# Patient Record
Sex: Female | Born: 1985 | Hispanic: No | Marital: Single | Smoking: Former smoker
Health system: Southern US, Community
[De-identification: ages and names within clinical notes are randomized; demographics above are authoritative.]

## PROBLEM LIST (undated history)

## (undated) DIAGNOSIS — F329 Major depressive disorder, single episode, unspecified: Secondary | ICD-10-CM

## (undated) DIAGNOSIS — F32A Depression, unspecified: Secondary | ICD-10-CM

## (undated) HISTORY — PX: NO PAST SURGERIES: SHX2092

---

## 2012-10-13 ENCOUNTER — Emergency Department (HOSPITAL_COMMUNITY): Payer: Self-pay

## 2012-10-13 ENCOUNTER — Emergency Department (HOSPITAL_COMMUNITY)
Admission: EM | Admit: 2012-10-13 | Discharge: 2012-10-13 | Disposition: A | Discharge status: Home / Self Care | Payer: Self-pay | Attending: Emergency Medicine | Admitting: Emergency Medicine

## 2012-10-13 ENCOUNTER — Encounter (HOSPITAL_COMMUNITY): Payer: Self-pay | Admitting: *Deleted

## 2012-10-13 DIAGNOSIS — S0010XA Contusion of unspecified eyelid and periocular area, initial encounter: Secondary | ICD-10-CM | POA: Insufficient documentation

## 2012-10-13 DIAGNOSIS — S0003XA Contusion of scalp, initial encounter: Secondary | ICD-10-CM | POA: Insufficient documentation

## 2012-10-13 DIAGNOSIS — S0083XA Contusion of other part of head, initial encounter: Secondary | ICD-10-CM

## 2012-10-13 DIAGNOSIS — T148XXA Other injury of unspecified body region, initial encounter: Secondary | ICD-10-CM

## 2012-10-13 MED ORDER — IBUPROFEN 600 MG PO TABS: 600.0000 mg | ORAL_TABLET | Freq: Four times a day (QID) | ORAL | Status: DC | PRN
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
## 2012-10-13 NOTE — ED Notes (Signed)
Returned from CT.
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
## 2012-10-13 NOTE — ED Provider Notes (Signed)
Medical screening examination/treatment/procedure(s) were performed by non-physician practitioner and as supervising physician I was immediately available for consultation/collaboration.   Sally Menard B. Lavaughn Bisig, MD 10/13/12 2240 

## 2012-10-13 NOTE — ED Notes (Signed)
Patient transported to CT 

## 2013-08-30 ENCOUNTER — Other Ambulatory Visit: Payer: Self-pay | Admitting: Family

## 2013-08-30 ENCOUNTER — Encounter (HOSPITAL_COMMUNITY): Payer: Self-pay | Admitting: Emergency Medicine

## 2013-08-30 ENCOUNTER — Emergency Department (HOSPITAL_COMMUNITY)
Admission: EM | Admit: 2013-08-30 | Discharge: 2013-08-30 | Disposition: A | Discharge status: Other Acute Inpatient Hospital | Payer: BC Managed Care – PPO | Attending: Emergency Medicine | Admitting: Emergency Medicine

## 2013-08-30 ENCOUNTER — Inpatient Hospital Stay (HOSPITAL_COMMUNITY)
Admission: AD | Admit: 2013-08-30 | Discharge: 2013-09-03 | DRG: 897 | Disposition: A | Discharge status: Home / Self Care | Payer: No Typology Code available for payment source | Source: Intra-hospital | Attending: Psychiatry | Admitting: Psychiatry

## 2013-08-30 ENCOUNTER — Encounter (HOSPITAL_COMMUNITY): Payer: Self-pay

## 2013-08-30 DIAGNOSIS — R21 Rash and other nonspecific skin eruption: Secondary | ICD-10-CM | POA: Insufficient documentation

## 2013-08-30 DIAGNOSIS — R000 Tachycardia, unspecified: Secondary | ICD-10-CM | POA: Insufficient documentation

## 2013-08-30 DIAGNOSIS — F1010 Alcohol abuse, uncomplicated: Secondary | ICD-10-CM

## 2013-08-30 DIAGNOSIS — F17200 Nicotine dependence, unspecified, uncomplicated: Secondary | ICD-10-CM | POA: Insufficient documentation

## 2013-08-30 DIAGNOSIS — R112 Nausea with vomiting, unspecified: Secondary | ICD-10-CM | POA: Insufficient documentation

## 2013-08-30 DIAGNOSIS — F1020 Alcohol dependence, uncomplicated: Principal | ICD-10-CM

## 2013-08-30 DIAGNOSIS — Z5987 Material hardship due to limited financial resources, not elsewhere classified: Secondary | ICD-10-CM

## 2013-08-30 DIAGNOSIS — Z8659 Personal history of other mental and behavioral disorders: Secondary | ICD-10-CM | POA: Insufficient documentation

## 2013-08-30 DIAGNOSIS — F329 Major depressive disorder, single episode, unspecified: Secondary | ICD-10-CM | POA: Diagnosis present

## 2013-08-30 DIAGNOSIS — F411 Generalized anxiety disorder: Secondary | ICD-10-CM

## 2013-08-30 DIAGNOSIS — Z598 Other problems related to housing and economic circumstances: Secondary | ICD-10-CM

## 2013-08-30 DIAGNOSIS — Z23 Encounter for immunization: Secondary | ICD-10-CM

## 2013-08-30 DIAGNOSIS — F3289 Other specified depressive episodes: Secondary | ICD-10-CM | POA: Diagnosis present

## 2013-08-30 HISTORY — DX: Depression, unspecified: F32.A

## 2013-08-30 HISTORY — DX: Major depressive disorder, single episode, unspecified: F32.9

## 2013-08-30 LAB — COMPREHENSIVE METABOLIC PANEL
Albumin: 4.6 g/dL (ref 3.5–5.2)
BUN: 7 mg/dL (ref 6–23)
Calcium: 9.3 mg/dL (ref 8.4–10.5)
Creatinine, Ser: 0.79 mg/dL (ref 0.50–1.10)
Total Bilirubin: 0.5 mg/dL (ref 0.3–1.2)
Total Protein: 8 g/dL (ref 6.0–8.3)

## 2013-08-30 LAB — CBC
HCT: 39 % (ref 36.0–46.0)
MCH: 28.7 pg (ref 26.0–34.0)
MCHC: 33.8 g/dL (ref 30.0–36.0)
MCV: 84.8 fL (ref 78.0–100.0)
RDW: 14.5 % (ref 11.5–15.5)

## 2013-08-30 LAB — SALICYLATE LEVEL: Salicylate Lvl: <2 mg/dL — ABNORMAL LOW (ref 2.8–20.0)

## 2013-08-30 LAB — ETHANOL: Alcohol, Ethyl (B): <11 mg/dL (ref 0–11)

## 2013-08-30 MED ORDER — LOPERAMIDE HCL 2 MG PO CAPS: 2.0000 mg | ORAL_CAPSULE | ORAL | Status: DC | PRN

## 2013-08-30 MED ORDER — ONDANSETRON HCL 4 MG PO TABS: 4.0000 mg | ORAL_TABLET | Freq: Three times a day (TID) | ORAL | Status: DC | PRN

## 2013-08-30 MED ORDER — THIAMINE HCL 100 MG/ML IJ SOLN: 100.0000 mg | Freq: Once | INTRAMUSCULAR | Status: DC

## 2013-08-30 MED ORDER — MAGNESIUM HYDROXIDE 400 MG/5ML PO SUSP: 30.0000 mL | Freq: Every day | ORAL | Status: DC | PRN

## 2013-08-30 MED ORDER — VITAMIN B-1 100 MG PO TABS: 100.0000 mg | ORAL_TABLET | Freq: Every day | ORAL | Status: DC

## 2013-08-30 MED ORDER — ADULT MULTIVITAMIN W/MINERALS CH: 1.0000 | ORAL_TABLET | Freq: Every day | ORAL | Status: DC

## 2013-08-30 MED ORDER — CHLORDIAZEPOXIDE HCL 25 MG PO CAPS: 25.0000 mg | ORAL_CAPSULE | Freq: Four times a day (QID) | ORAL | Status: DC | PRN

## 2013-08-30 MED ORDER — CHLORDIAZEPOXIDE HCL 25 MG PO CAPS
25.0000 mg | ORAL_CAPSULE | Freq: Four times a day (QID) | ORAL | Status: AC | PRN
  Administered 2013-09-01 – 2013-09-02 (×2): 25 mg via ORAL
  Filled 2013-08-30 (×2): qty 1

## 2013-08-30 MED ORDER — LORAZEPAM 1 MG PO TABS: 0.0000 mg | ORAL_TABLET | Freq: Two times a day (BID) | ORAL | Status: DC

## 2013-08-30 MED ORDER — HYDROCORTISONE 1 % EX CREA
TOPICAL_CREAM | Freq: Two times a day (BID) | CUTANEOUS | Status: DC
Start: 2013-08-30 — End: 2013-09-03
  Administered 2013-08-30 – 2013-09-01 (×5): via TOPICAL
  Administered 2013-09-03: 1 via TOPICAL
  Filled 2013-08-30 (×2): qty 28

## 2013-08-30 MED ORDER — ONDANSETRON 4 MG PO TBDP: 4.0000 mg | ORAL_TABLET | Freq: Four times a day (QID) | ORAL | Status: DC | PRN

## 2013-08-30 MED ORDER — TERBINAFINE HCL 1 % EX CREA
TOPICAL_CREAM | Freq: Two times a day (BID) | CUTANEOUS | Status: DC
  Administered 2013-08-30 – 2013-09-03 (×6): via TOPICAL
  Filled 2013-08-30 (×2): qty 12
  Filled 2013-08-30: qty 30
  Filled 2013-08-30: qty 12

## 2013-08-30 MED ORDER — CHLORDIAZEPOXIDE HCL 25 MG PO CAPS
25.0000 mg | ORAL_CAPSULE | Freq: Three times a day (TID) | ORAL | Status: AC
  Administered 2013-09-01 (×3): 25 mg via ORAL
  Filled 2013-08-30 (×3): qty 1

## 2013-08-30 MED ORDER — ZOLPIDEM TARTRATE 5 MG PO TABS: 5.0000 mg | ORAL_TABLET | Freq: Every evening | ORAL | Status: DC | PRN

## 2013-08-30 MED ORDER — CHLORDIAZEPOXIDE HCL 25 MG PO CAPS: 25.0000 mg | ORAL_CAPSULE | Freq: Three times a day (TID) | ORAL | Status: DC

## 2013-08-30 MED ORDER — CHLORDIAZEPOXIDE HCL 25 MG PO CAPS: 25.0000 mg | ORAL_CAPSULE | ORAL | Status: DC

## 2013-08-30 MED ORDER — NICOTINE 21 MG/24HR TD PT24: 21.0000 mg | MEDICATED_PATCH | Freq: Every day | TRANSDERMAL | Status: DC

## 2013-08-30 MED ORDER — CHLORDIAZEPOXIDE HCL 25 MG PO CAPS
25.0000 mg | ORAL_CAPSULE | ORAL | Status: AC
  Administered 2013-09-02: 25 mg via ORAL
  Filled 2013-08-30 (×3): qty 1

## 2013-08-30 MED ORDER — CHLORDIAZEPOXIDE HCL 25 MG PO CAPS: 25.0000 mg | ORAL_CAPSULE | Freq: Four times a day (QID) | ORAL | Status: DC

## 2013-08-30 MED ORDER — HYDROXYZINE HCL 25 MG PO TABS
25.0000 mg | ORAL_TABLET | Freq: Four times a day (QID) | ORAL | Status: AC | PRN
  Administered 2013-08-30 – 2013-09-01 (×3): 25 mg via ORAL
  Filled 2013-08-30 (×3): qty 1

## 2013-08-30 MED ORDER — INFLUENZA VAC SPLIT QUAD 0.5 ML IM SUSP
0.5000 mL | INTRAMUSCULAR | Status: DC
  Filled 2013-08-30: qty 0.5

## 2013-08-30 MED ORDER — ADULT MULTIVITAMIN W/MINERALS CH
1.0000 | ORAL_TABLET | Freq: Every day | ORAL | Status: DC
  Administered 2013-08-30 – 2013-09-03 (×4): 1 via ORAL
  Filled 2013-08-30 (×8): qty 1

## 2013-08-30 MED ORDER — ALUM & MAG HYDROXIDE-SIMETH 200-200-20 MG/5ML PO SUSP: 30.0000 mL | ORAL | Status: DC | PRN

## 2013-08-30 MED ORDER — ONDANSETRON 4 MG PO TBDP: 4.0000 mg | ORAL_TABLET | Freq: Four times a day (QID) | ORAL | Status: AC | PRN

## 2013-08-30 MED ORDER — CHLORDIAZEPOXIDE HCL 25 MG PO CAPS
25.0000 mg | ORAL_CAPSULE | Freq: Four times a day (QID) | ORAL | Status: AC
  Administered 2013-08-30 – 2013-08-31 (×5): 25 mg via ORAL
  Filled 2013-08-30 (×5): qty 1

## 2013-08-30 MED ORDER — ACETAMINOPHEN 325 MG PO TABS: 650.0000 mg | ORAL_TABLET | ORAL | Status: DC | PRN

## 2013-08-30 MED ORDER — LORAZEPAM 1 MG PO TABS
0.0000 mg | ORAL_TABLET | Freq: Four times a day (QID) | ORAL | Status: DC
  Administered 2013-08-30: 1 mg via ORAL
  Filled 2013-08-30: qty 1

## 2013-08-30 MED ORDER — TERBINAFINE HCL 1 % EX CREA
TOPICAL_CREAM | Freq: Every day | CUTANEOUS | Status: DC
  Filled 2013-08-30: qty 12

## 2013-08-30 MED ORDER — HYDROXYZINE HCL 25 MG PO TABS: 25.0000 mg | ORAL_TABLET | Freq: Four times a day (QID) | ORAL | Status: DC | PRN

## 2013-08-30 MED ORDER — VITAMIN B-1 100 MG PO TABS
100.0000 mg | ORAL_TABLET | Freq: Every day | ORAL | Status: DC
  Administered 2013-08-31 – 2013-09-03 (×3): 100 mg via ORAL
  Filled 2013-08-30 (×6): qty 1

## 2013-08-30 MED ORDER — CHLORDIAZEPOXIDE HCL 25 MG PO CAPS: 25.0000 mg | ORAL_CAPSULE | Freq: Every day | ORAL | Status: DC

## 2013-08-30 MED ORDER — LOPERAMIDE HCL 2 MG PO CAPS: 2.0000 mg | ORAL_CAPSULE | ORAL | Status: AC | PRN

## 2013-08-30 MED ORDER — ACETAMINOPHEN 325 MG PO TABS
650.0000 mg | ORAL_TABLET | Freq: Four times a day (QID) | ORAL | Status: DC | PRN
  Administered 2013-09-02: 650 mg via ORAL
  Filled 2013-08-30: qty 2

## 2013-08-30 MED ORDER — CHLORDIAZEPOXIDE HCL 25 MG PO CAPS
25.0000 mg | ORAL_CAPSULE | Freq: Every day | ORAL | Status: AC
  Administered 2013-09-03: 25 mg via ORAL

## 2013-08-30 MED ORDER — CHLORDIAZEPOXIDE HCL 25 MG PO CAPS
25.0000 mg | ORAL_CAPSULE | Freq: Once | ORAL | Status: AC
  Administered 2013-08-30: 25 mg via ORAL
  Filled 2013-08-30: qty 1

## 2013-08-30 NOTE — ED Provider Notes (Signed)
Patient accepted at behavioral [facility] by Dr. Dub Mikes. 1:40 PM patient is alert Glasgow Coma Score 15. Appears comfortable. Diagnosis #1 chronic alcohol abuse  #2 rash  [name], MD 08/30/13 1345

## 2013-08-30 NOTE — ED Notes (Signed)
Pt from home requesting detox from ETOH. Pt last drink was last pm. Pt states that she drinks approx 2 bottles of wine, or "until I get wasted" for 7 years. Pt is A&O and in NAD. Pt adds that she needs to be seen for rash and bites

## 2013-08-30 NOTE — Tx Team (Signed)
Initial Interdisciplinary Treatment Plan  PATIENT STRENGTHS: (choose at least two) Capable of independent living General fund of knowledge Motivation for treatment/growth  PATIENT STRESSORS: Medication change or noncompliance Substance abuse   PROBLEM LIST: Problem List/Patient Goals Date to be addressed Date deferred Reason deferred Estimated date of resolution  ETOH Dependence                                                       DISCHARGE CRITERIA:  Ability to meet basic life and health needs Withdrawal symptoms are absent or subacute and managed without 24-hour nursing intervention  PRELIMINARY DISCHARGE PLAN: Attend 12-step recovery group Return to previous work or school arrangements  PATIENT/FAMIILY INVOLVEMENT: This treatment plan has been presented to and reviewed with the patient, Cyara Devoto, and/or family member, .  The patient and family have been given the opportunity to ask questions and make suggestions.  Andrena Mews 08/30/2013, 4:02 PM

## 2013-08-30 NOTE — BHH Counselor (Signed)
TTS consult called in from [facility]. Writer called TTS Ava at [facility] and informed Ava that pt needs consult.  Evette Cristal, [place] Assessment Counselor

## 2013-08-30 NOTE — Progress Notes (Signed)
Writer consulted with Dr. Lolly Mustache regarding the patient meeting criteria for inpatient hospitalization for detox.  Writer has referred the patient to [facility].  The [facility] Minerva Areola) is reviewing the patient.

## 2013-08-30 NOTE — ED Provider Notes (Signed)
Medical screening examination/treatment/procedure(s) were conducted as a shared visit with non-physician practitioner(s) and myself.  I personally evaluated the patient during the encounter.  EKG Interpretation   None        [name], MD 08/30/13 1605

## 2013-08-30 NOTE — Progress Notes (Signed)
Patient has been accepted to [facility] Bed 303-2.  Dr. Dub Mikes is the accepting doctor.  Writer informed the nurse working with the patient.
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
## 2013-08-30 NOTE — Consult Note (Signed)
[facility] Face-to-Face Psychiatry Consult   Reason for Consult:  Detox for ETOH Referring Physician:  EDP Bridget Salazar is an 27 y.o. female.  Assessment: AXIS I:  Alcohol Abuse AXIS II:  Deferred AXIS III:   Past Medical History  Diagnosis Date  . Depression    AXIS IV:  other psychosocial or environmental problems, problems related to social environment and problems with primary support group AXIS V:  41-50 serious symptoms  Plan:  No evidence of imminent risk to self or others at present.   Recommend psychiatric Inpatient admission when medically cleared.  Subjective:   Bridget Salazar is a 26 y.o. female patient presenting to the [facility] Emergency Department requesting inpatient detox from ETOH. Pt reports that she consumes 2 bottles of wine daily and has been doing so off and on for approximately 7 years. Pt reports intermittent use of Xanax (unknown dosage) from friends and non-prescription sources, but denies frequent use or abuse. Reports that she lives alone in [place] and that her family lives in [place]. Reports a good support system with her family including her adopted parents and her brothers; her biological family is in [place] and she was adopted, moving from there to the [place] the age of 54. Reports that she is in college but will take this coming semester off to "get clean" and detox. Denies SI, HI, and Psychosis.   HPI:  27yo female presented to the ED voluntarily for ETOH detox; wants inpatient tx. Never sought tx for detox in the past. Alert, orientedx4, and in NAD, negative for withdrawal symptoms. Drinks 2 bottles of wine daily (x7 yrs), intermittent Xanax (street sources), and daily smoker. Adopted at age 12 from [place]; was in therapy during high school to cope with the adoption transition and did take Prozac briefly at that time. Not on any psych meds. WBC's elevated at 11.4; AST/ALT marginally elevated (50/42). BAL <11. Pt has just been accepted to [facility] Bed  303-2 as of 1:10pm.   Accepted to bed 303-2  HPI Elements:   Location:  Generalized. Quality:  persistent, wants detox. Severity:  severe. Timing:  constant. Duration:  7 years.  Past Psychiatric History: Past Medical History  Diagnosis Date  . Depression     reports that she has been smoking Cigarettes.  She has been smoking about 0.50 packs per day. She does not have any smokeless tobacco history on file. She reports that she drinks about 33.6 ounces of alcohol per week. She reports that she does not use illicit drugs. No family history on file.         Allergies:  No Known Allergies  ACT Assessment Complete:  N/A Objective: Blood pressure 138/95, pulse 98, temperature 98.1 F (36.7 C), temperature source Oral, resp. rate 18, last menstrual period 08/30/2013, SpO2 100.00%.There is no height or weight on file to calculate BMI. Results for orders placed during the hospital encounter of 08/30/13 (from the past 72 hour(s))  ACETAMINOPHEN LEVEL     Status: None   Collection Time    08/30/13  8:20 AM      Result Value Range   Acetaminophen (Tylenol), Serum <15.0  10 - 30 ug/mL   Comment:            THERAPEUTIC CONCENTRATIONS VARY     SIGNIFICANTLY. A RANGE OF 10-30     ug/mL MAY BE AN EFFECTIVE     CONCENTRATION FOR MANY PATIENTS.     HOWEVER, SOME ARE BEST TREATED  AT CONCENTRATIONS OUTSIDE THIS     RANGE.     ACETAMINOPHEN CONCENTRATIONS     >150 ug/mL AT 4 HOURS AFTER     INGESTION AND >50 ug/mL AT 12     HOURS AFTER INGESTION ARE     OFTEN ASSOCIATED WITH TOXIC     REACTIONS.  CBC     Status: Abnormal   Collection Time    08/30/13  8:20 AM      Result Value Range   WBC 11.4 (*) 4.0 - 10.5 K/uL   RBC 4.60  3.87 - 5.11 MIL/uL   Hemoglobin 13.2  12.0 - 15.0 g/dL   HCT 46.9  62.9 - 52.8 %   MCV 84.8  78.0 - 100.0 fL   MCH 28.7  26.0 - 34.0 pg   MCHC 33.8  30.0 - 36.0 g/dL   RDW 41.3  24.4 - 01.0 %   Platelets 220  150 - 400 K/uL  COMPREHENSIVE METABOLIC  PANEL     Status: Abnormal   Collection Time    08/30/13  8:20 AM      Result Value Range   Sodium 136  135 - 145 mEq/L   Potassium 3.5  3.5 - 5.1 mEq/L   Chloride 97  96 - 112 mEq/L   CO2 23  19 - 32 mEq/L   Glucose, Bld 97  70 - 99 mg/dL   BUN 7  6 - 23 mg/dL   Creatinine, Ser 2.72  0.50 - 1.10 mg/dL   Calcium 9.3  8.4 - 53.6 mg/dL   Total Protein 8.0  6.0 - 8.3 g/dL   Albumin 4.6  3.5 - 5.2 g/dL   AST 50 (*) 0 - 37 U/L   ALT 42 (*) 0 - 35 U/L   Alkaline Phosphatase 62  39 - 117 U/L   Total Bilirubin 0.5  0.3 - 1.2 mg/dL   GFR calc non Af Amer >90  >90 mL/min   GFR calc Af Amer >90  >90 mL/min   Comment: (NOTE)     The eGFR has been calculated using the CKD EPI equation.     This calculation has not been validated in all clinical situations.     eGFR's persistently <90 mL/min signify possible Chronic Kidney     Disease.  ETHANOL     Status: None   Collection Time    08/30/13  8:20 AM      Result Value Range   Alcohol, Ethyl (B) <11  0 - 11 mg/dL   Comment:            LOWEST DETECTABLE LIMIT FOR     SERUM ALCOHOL IS 11 mg/dL     FOR MEDICAL PURPOSES ONLY  SALICYLATE LEVEL     Status: Abnormal   Collection Time    08/30/13  8:20 AM      Result Value Range   Salicylate Lvl <2.0 (*) 2.8 - 20.0 mg/dL   Labs are reviewed and are pertinent for WBC's elevated at 11.4.  Current Facility-Administered Medications  Medication Dose Route Frequency Provider Last Rate Last Dose  . LORazepam (ATIVAN) tablet 0-4 mg  0-4 mg Oral Q6H [name], MD       Followed by  . [START ON 09/01/2013] LORazepam (ATIVAN) tablet 0-4 mg  0-4 mg Oral Q12H [name], MD      . nicotine (NICODERM CQ - dosed in mg/24 hours) patch 21 mg  21 mg Transdermal Daily [name], MD      .  ondansetron (ZOFRAN) tablet 4 mg  4 mg Oral Q8H PRN [name], MD      . zolpidem (AMBIEN) tablet 5 mg  5 mg Oral QHS PRN [name], MD       No current outpatient prescriptions on file.     Psychiatric Specialty Exam:     Blood pressure 138/95, pulse 98, temperature 98.1 F (36.7 C), temperature source Oral, resp. rate 18, last menstrual period 08/30/2013, SpO2 100.00%.There is no height or weight on file to calculate BMI.  General Appearance: Casual  Eye Contact::  Good  Speech:  Clear and Coherent and Normal Rate  Volume:  Normal  Mood:  Euthymic  Affect:  Appropriate  Thought Process:  Coherent  Orientation:  Full (Time, Place, and Person)  Thought Content:  WDL  Suicidal Thoughts:  No  Homicidal Thoughts:  No  Memory:  Immediate;   Good Recent;   Good Remote;   Good  Judgement:  Fair  Insight:  Fair  Psychomotor Activity:  Normal  Concentration:  Good  Recall:  Good  Akathisia:  No  Handed:  Right  AIMS (if indicated):     Assets:  Communication Skills Desire for Improvement Housing Leisure Time Physical Health Resilience  Sleep:      Treatment Plan Summary: Daily contact with patient to assess and evaluate symptoms and progress in treatment Medication management. Librium protocol initiated. Will seek long-term detox rehab such as [facility] or [facility]. Pt has just been accepted to [facility] room 303-02 and will be transferred there shortly.    Beau Fanny, FNP-BC 08/30/2013 12:52 PM  I have personally seen the patient and agreed with the findings and involved in the treatment plan. Kathryne Sharper, MD

## 2013-08-30 NOTE — ED Provider Notes (Signed)
Patient presents requesting detox from alcohol. She drinks 2 bottles of wine per day and 1"loco" her day. Last drink last night. She also hasn't had a rash for several weeks at both axillae which is improving with time. On exam patient is alert Glasgow Coma Score 15 and a little difficulty. Skin there is a pinkish grayish appearing HEENT upper rash of the bilateral axillae. TTS consult called Results for orders placed during the hospital encounter of 08/30/13  ACETAMINOPHEN LEVEL      Result Value Range   Acetaminophen (Tylenol), Serum <15.0  10 - 30 ug/mL  CBC      Result Value Range   WBC 11.4 (*) 4.0 - 10.5 K/uL   RBC 4.60  3.87 - 5.11 MIL/uL   Hemoglobin 13.2  12.0 - 15.0 g/dL   HCT 16.1  09.6 - 04.5 %   MCV 84.8  78.0 - 100.0 fL   MCH 28.7  26.0 - 34.0 pg   MCHC 33.8  30.0 - 36.0 g/dL   RDW 40.9  81.1 - 91.4 %   Platelets 220  150 - 400 K/uL  COMPREHENSIVE METABOLIC PANEL      Result Value Range   Sodium 136  135 - 145 mEq/L   Potassium 3.5  3.5 - 5.1 mEq/L   Chloride 97  96 - 112 mEq/L   CO2 23  19 - 32 mEq/L   Glucose, Bld 97  70 - 99 mg/dL   BUN 7  6 - 23 mg/dL   Creatinine, Ser 7.82  0.50 - 1.10 mg/dL   Calcium 9.3  8.4 - 95.6 mg/dL   Total Protein 8.0  6.0 - 8.3 g/dL   Albumin 4.6  3.5 - 5.2 g/dL   AST 50 (*) 0 - 37 U/L   ALT 42 (*) 0 - 35 U/L   Alkaline Phosphatase 62  39 - 117 U/L   Total Bilirubin 0.5  0.3 - 1.2 mg/dL   GFR calc non Af Amer >90  >90 mL/min   GFR calc Af Amer >90  >90 mL/min  ETHANOL      Result Value Range   Alcohol, Ethyl (B) <11  0 - 11 mg/dL  SALICYLATE LEVEL      Result Value Range   Salicylate Lvl <2.0 (*) 2.8 - 20.0 mg/dL   No results found.   [name], MD 08/30/13 1021

## 2013-08-30 NOTE — Progress Notes (Signed)
Pt is a 27 year old female admitted with ETOH Dependence who is requesting to be detoxed   She attends college and she is using her time off school to detox and try to stay clean   She denies suicidal and homicidal ideation   She denies auditory or visual hallucinations   She has a rash like mass under her arms which she is using lamisil for and she complains of itching on her arms and is using cortisone cream for that    She said she had been laying around with a female that wasn't clean and thinks she may have some kind of lice although no signs of it were seen in her skin exam  She reports some mild withdrawal symptoms  Shakes chills and sweats and anxiety    Pt was oriented to the unit and offered nourishment   Q 15 min checks explained and are being done   Pt is safe at present time

## 2013-08-31 ENCOUNTER — Encounter (HOSPITAL_COMMUNITY): Payer: Self-pay | Admitting: Psychiatry

## 2013-08-31 DIAGNOSIS — F411 Generalized anxiety disorder: Secondary | ICD-10-CM | POA: Diagnosis present

## 2013-08-31 MED ORDER — ESCITALOPRAM OXALATE 10 MG PO TABS
10.0000 mg | ORAL_TABLET | Freq: Every day | ORAL | Status: DC
  Administered 2013-08-31 – 2013-09-03 (×3): 10 mg via ORAL
  Filled 2013-08-31 (×4): qty 1
  Filled 2013-08-31: qty 14
  Filled 2013-08-31 (×2): qty 1

## 2013-08-31 NOTE — Progress Notes (Signed)
Adult Psychoeducational Group Note  Date:  08/31/2013 Time:  10:29 PM  Group Topic/Focus:  AA group  Participation Level:  Active  Participation Quality:  Appropriate  Affect:  Appropriate  Cognitive:  Alert  Insight: Appropriate  Engagement in Group:  Engaged  Modes of Intervention:  Discussion  Additional Comments:  Pt states that drinking is not out of the norm for her. She does not feel that she needs to be here. She feels that she has her life together and does not need to make any changes.  Kaleen Odea R 08/31/2013, 10:29 PM

## 2013-08-31 NOTE — [facility] Group Notes (Signed)
[facility] LCSW Group Therapy  08/31/2013 3:40 PM  Type of Therapy:  Group Therapy  Participation Level:  Did Not Attend-pt asleep after lunch/did not attend afternoon group.   Smart, HeatherLCSWA  08/31/2013, 3:40 PM

## 2013-08-31 NOTE — [facility] Suicide Risk Assessment (Signed)
Suicide Risk Assessment  Admission Assessment     Nursing information obtained from:    Demographic factors:    Current Mental Status:    patient is casually dressed and fairly groomed.  She is anxious but cooperative.  She denied any suicidal thoughts or homicidal thoughts.  She described her mood as nervous and her affect is mood appropriate.  There were no paranoia, delusions obsession comes in at this time.  Her thought processes is logical and goal-directed.  There were no auditory or visual hallucination.  She is relevant and conversation.  She has shakes and tremors.  Her psychomotor activity is normal.  She's alert and oriented x3.  Her insight judgment and impulse control is okay. Loss Factors:    Historical Factors:    Risk Reduction Factors:     CLINICAL FACTORS:   Alcohol/Substance Abuse/Dependencies  COGNITIVE FEATURES THAT CONTRIBUTE TO RISK:  Polarized thinking    SUICIDE RISK:   Minimal: No identifiable suicidal ideation.  Patients presenting with no risk factors but with morbid ruminations; may be classified as minimal risk based on the severity of the depressive symptoms  PLAN OF CARE: Please see complete admission note the  I certify that inpatient services furnished can reasonably be expected to improve the patient's condition.  Achol Azpeitia T. 08/31/2013, 9:22 AM

## 2013-08-31 NOTE — [facility] Suicide Risk Assessment (Signed)
[facility] INPATIENT: Family/Significant Other Suicide Prevention Education  Suicide Prevention Education:  Education Completed; No one has been identified by the patient as the family member/significant other with whom the patient will be residing, and identified as the person(s) who will aid the patient in the event of a mental health crisis (suicidal ideations/suicide attempt).   Pt did not c/o SI at admission, nor have they endorsed SI during their stay here. SPE not required. SPI pamphlet provided to pt and she was encouraged to share this with support network, ask questions, and talk about any concerns relating to SPE.  [employer], LCSWA 08/31/2013 11:48 AM

## 2013-08-31 NOTE — [facility] Group Notes (Signed)
[facility] LCSW Aftercare Discharge Planning Group Note   08/31/2013 11:42 AM  Participation Quality:  Appropriate   Mood/Affect:  Appropriate  Depression Rating:  6  Anxiety Rating:  3  Thoughts of Suicide:  No Will you contract for safety?   NA  Current AVH:  No  Plan for Discharge/Comments:  Pt reports that she lives alone in [place] and does not have any mental health providers. She has [facility] and would like to followup with [facility] o/p for med management and therapy. Pt feels that her anxiety/depression fuel her alcohol abuse. No withdrawal symptoms reported by pt today.   Transportation Means: bus   Supports: none-"my family will not speak to me until I am sober."   Smart, [address]
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
## 2013-08-31 NOTE — Progress Notes (Signed)
Patient ID: Bridget Salazar, female   DOB: 07-31-1986, 27 y.o.   MRN: [id number] D. Patient presents with depressed, anxious mood, affect blunted. She reports '' I'm feeling okay, just really tired from my withdrawal. I just feel so tired . Pt also continues to be very focused on skin stating '' I guess I'm just sort of going crazy about it , you know before I came in the hospital with this break down I saw a bug on me. Now I'm just thinking about all sorts of things like bed bugs and lice and nits and such''  Pt completed self inventory and rates depression at 6/10 on depression scale , 1 being least depression 10 being worst. She denies SI. A. Support and encouragement provided. Medications given as ordered. Skin assessed and no bugs noted. Pt provided creams as md ordered. R. Pt has been visible in the dayroom, coloring . In no acute distress at this time. No further voiced concerns at this time. Will continue to monitor q 15 minutes for safety.
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
## 2013-08-31 NOTE — Progress Notes (Signed)
D.  Pt pleasant on approach, denies complaints at this time.  Positive for evening AA group.  Interacting appropriately within milieu.  Denies SI/HI/hallucinations at this time.  A.  Support and encouragement offered  R.  Pt remain safe, will continue to monitor.

## 2013-09-01 DIAGNOSIS — F411 Generalized anxiety disorder: Secondary | ICD-10-CM

## 2013-09-01 DIAGNOSIS — F1020 Alcohol dependence, uncomplicated: Principal | ICD-10-CM

## 2013-09-01 NOTE — Progress Notes (Signed)
Patient did attend the evening speaker AA meeting.  

## 2013-09-01 NOTE — [facility] Group Notes (Signed)
[facility] Group Notes:  (Nursing/MHT/Case Management/Adjunct)  Date:  09/01/2013  Time:  1:55 PM  Type of Therapy:  Psychoeducational Skills  Participation Level:  Minimal  Participation Quality:  Drowsy  Affect:  Irritable  Cognitive:  Lacking  Insight:  Lacking  Engagement in Group:  Lacking  Modes of Intervention:  Discussion, Education and Exploration  Summary of Progress/Problems: healthy coping skill review with a focus on recovery through addiction.   Bridget Salazar 09/01/2013, 1:55 PM

## 2013-09-01 NOTE — Progress Notes (Signed)
D.  Pt pleasant on approach, denies complaints at this time.  Some anxiety noted.  Denies SI/HI/hallucinations at this time.  Positive for evening AA group.  Interacting appropriately within milieu.  A.  Support and encouragement offered   R.  Pt remains safe on unit, will continue to monitor.

## 2013-09-01 NOTE — Progress Notes (Signed)
[facility] MD Progress Note  09/01/2013 4:58 PM Grizel Vesely  MRN:  [id number]  Subjective:  Isabelle Course reports, "I feel terrible because I don't think that this is the right place for me to be. I made a mistake by agreeing to come here. I need to be discharged so that I can go home and arrange for my own private psychiatrist and therapist. Money is not a problem for me. My parents are on vacation, they don't know that I'm in this hospital. If they realize that I'm in a place like this, they will be upset"  Diagnosis:   DSM5: Schizophrenia Disorders:  NA Obsessive-Compulsive Disorders:  NA Trauma-Stressor Disorders:  NA Substance/Addictive Disorders:  Alcohol Related Disorder - Severe (303.90) Depressive Disorders:  Generalized anxiety disorder  Axis I: Alcohol Related Disorder - Severe (303.90), Generalized anxiety disorder Axis II: Deferred Axis III:  Past Medical History  Diagnosis Date  . Depression    Axis IV: other psychosocial or environmental problems and Alcoholism Axis V: 41-50 serious symptoms  ADL's:  Fairly intact  Sleep: Fair  Appetite:  Fair  Suicidal Ideation:  Plan:  Denies Intent:  Denies Means:  Denies  Homicidal Ideation:  Plan:  Denies Intent:  Denies Means:  Denies AEB (as evidenced by):  Psychiatric Specialty Exam: Review of Systems  Constitutional: Negative.   HENT: Negative.   Eyes: Negative.   Respiratory: Negative.   Cardiovascular: Negative.   Gastrointestinal: Negative.   Genitourinary: Negative.   Musculoskeletal: Negative.   Skin: Negative.   Neurological: Negative.   Endo/Heme/Allergies: Negative.   Psychiatric/Behavioral: Positive for depression (Currently being stabilized with medication) and substance abuse (Alcoholism). Negative for suicidal ideas, hallucinations and memory loss. The patient is nervous/anxious (Currently being stabilized with medication). The patient does not have insomnia.     Blood pressure 108/74, pulse 64,  temperature 97.9 F (36.6 C), temperature source Oral, resp. rate 16, height 5\' 7"  (1.702 m), last menstrual period 08/30/2013, SpO2 99.00%.There is no weight on file to calculate BMI.  General Appearance: Fairly Groomed  Patent attorney::  Poor  Speech:  Clear and Coherent  Volume:  Normal  Mood:  Angry, Anxious, Depressed and Irritable  Affect:  Restricted  Thought Process:  Coherent and Intact  Orientation:  Full (Time, Place, and Person)  Thought Content:  Rumination  Suicidal Thoughts:  No  Homicidal Thoughts:  No  Memory:  Immediate;   Good Recent;   Good Remote;   Good  Judgement:  Impaired  Insight:  Lacking  Psychomotor Activity:  Restlessness and Tremor  Concentration:  Fair  Recall:  Good  Akathisia:  No  Handed:  Right  AIMS (if indicated):     Assets:  Desire for Improvement  Sleep:  Number of Hours: 6.5   Current Medications: Current Facility-Administered Medications  Medication Dose Route Frequency Provider Last Rate Last Dose  . acetaminophen (TYLENOL) tablet 650 mg  650 mg Oral Q6H PRN Nanine Means, NP      . alum & mag hydroxide-simeth (MAALOX/MYLANTA) 200-200-20 MG/5ML suspension 30 mL  30 mL Oral Q4H PRN Nanine Means, NP      . chlordiazePOXIDE (LIBRIUM) capsule 25 mg  25 mg Oral Q6H PRN Nanine Means, NP      . chlordiazePOXIDE (LIBRIUM) capsule 25 mg  25 mg Oral TID Nanine Means, NP   25 mg at 09/01/13 1143   Followed by  . [START ON 09/02/2013] chlordiazePOXIDE (LIBRIUM) capsule 25 mg  25 mg Oral BH-qamhs Nanine Means, NP  Followed by  . [START ON 09/03/2013] chlordiazePOXIDE (LIBRIUM) capsule 25 mg  25 mg Oral Daily Nanine Means, NP      . escitalopram (LEXAPRO) tablet 10 mg  10 mg Oral Daily Nanine Means, NP   10 mg at 09/01/13 0754  . hydrocortisone cream 1 %   Topical BID Nanine Means, NP      . hydrOXYzine (ATARAX/VISTARIL) tablet 25 mg  25 mg Oral Q6H PRN Nanine Means, NP   25 mg at 08/31/13 2233  . influenza vac split quadrivalent PF (FLUARIX)  injection 0.5 mL  0.5 mL Intramuscular Tomorrow-1000 Rachael Fee, MD      . loperamide (IMODIUM) capsule 2-4 mg  2-4 mg Oral PRN Nanine Means, NP      . magnesium hydroxide (MILK OF MAGNESIA) suspension 30 mL  30 mL Oral Daily PRN Nanine Means, NP      . multivitamin with minerals tablet 1 tablet  1 tablet Oral Daily Nanine Means, NP   1 tablet at 09/01/13 0756  . ondansetron (ZOFRAN-ODT) disintegrating tablet 4 mg  4 mg Oral Q6H PRN Nanine Means, NP      . terbinafine (LAMISIL) 1 % cream   Topical BID Nanine Means, NP      . thiamine (VITAMIN B-1) tablet 100 mg  100 mg Oral Daily Nanine Means, NP   100 mg at 09/01/13 1610    Lab Results: No results found for this or any previous visit (from the past 48 hour(s)).  Physical Findings: AIMS: Facial and Oral Movements Muscles of Facial Expression: None, normal Lips and Perioral Area: None, normal Jaw: None, normal Tongue: None, normal,Extremity Movements Upper (arms, wrists, hands, fingers): None, normal Lower (legs, knees, ankles, toes): None, normal, Trunk Movements Neck, shoulders, hips: None, normal, Overall Severity Severity of abnormal movements (highest score from questions above): None, normal Incapacitation due to abnormal movements: None, normal Patient's awareness of abnormal movements (rate only patient's report): No Awareness, Dental Status Current problems with teeth and/or dentures?: No Does patient usually wear dentures?: No  CIWA:  CIWA-Ar Total: 0 COWS:     Treatment Plan Summary: Daily contact with patient to assess and evaluate symptoms and progress in treatment Medication management  Plan: Supportive approach/coping skills/relapse prevention. Continue detox Encouraged out of room, participation in group sessions and application of coping skills when distressed. Will continue to monitor response to/adverse effects of medications in use to assure effectiveness. Continue to monitor mood, behavior and interaction  with staff and other patients. Continue current plan of care.  Medical Decision Making Problem Points:  Review of last therapy session (1) and Review of psycho-social stressors (1) Data Points:  Review of medication regiment & side effects (2) Review of new medications or change in dosage (2)  I certify that inpatient services furnished can reasonably be expected to improve the patient's condition.   Sanjuana Kava, PMHNP, FNP 09/01/2013, 4:58 PM

## 2013-09-01 NOTE — [facility] Group Notes (Signed)
[facility] Group Notes:  (Nursing/MHT/Case Management/Adjunct)  Date:  09/01/2013  Time:  9:30 AM  Type of Therapy:  Psychoeducational Skills  Participation Level:  Did Not Attend  Participation Quality:  na  Affect:  na  Cognitive:  na  Insight:  None  Engagement in Group:  na  Modes of Intervention:  na  Summary of Progress/Problems:  Bridget Salazar 09/01/2013, 9:30 AM
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
## 2013-09-01 NOTE — [facility] Group Notes (Signed)
[facility] LCSW Group Therapy  09/01/2013 10:00 AM  Type of Therapy:  Group Therapy  Participation Level:  Active  Participation Quality:  Intrusive, Inattentive and Sharing  Affect:  Anxious  Cognitive:  Alert and Oriented  Insight:  Limited  Engagement in Therapy:  Limited  Modes of Intervention:  Clarification, Discussion, Exploration, Rapport Building, Socialization and Support  Summary of Progress/Problems: The main focus of today's process group was for the patient to identify ways in which they have in the past sabotaged their own recovery. Motivational Interviewing was utilized to ask the group members what they get out of their substance use, and what reasons they may have for wanting to change. The Stages of Change were explained using a handout, and patients identified where they currently are with regard to stages of change. Eugenia was intrussive at multiple points advocationg for self to be discharged as she believes she has no issues warranting inpatient treatment. Patient denied self sabotage and believes the changes she wishes to implement (getting a therapist and psychiatrist) would best be implemented outside the hospital; "this is bringing me down being here."  Clide Dales 09/01/2013

## 2013-09-02 MED ORDER — INFLUENZA VAC SPLIT QUAD 0.5 ML IM SUSP
0.5000 mL | INTRAMUSCULAR | Status: DC
  Filled 2013-09-02: qty 0.5

## 2013-09-02 NOTE — Progress Notes (Signed)
Patient ID: Bridget Salazar, female   DOB: [birth year], 27 y.o.   MRN: [id number] D. Patient presents with irritable mood, affect irritable. She has been isolative to room, refusing medications and resistant to care . She states '' I don't want any fucking medications, that's probably what's making me feel bad. I just want to fucking go home. When is the doctor going to be here? '' She has been isolative and not interactive on the unit. A. Support and encouragement provided. Discussed above information with provider. R. Patient remains irritable, resistant to care. Will continue to monitor q 15 minutes.

## 2013-09-02 NOTE — Progress Notes (Signed)
[facility] Group Notes:  (Nursing/MHT/Case Management/Adjunct)  Date:  09/02/2013  Time:  3:39 PM  Type of Therapy:  Psychoeducational Skills  Participation Level:  Did Not Attend  Summary of Progress/Problems: pt was in bed asleep.  Caswell Corwin 09/02/2013, 3:39 PM

## 2013-09-02 NOTE — Progress Notes (Signed)
D.  Pt with flat affect on approach, requested bedtime medication early this evening.  Positive for evening AA group.  Interacting appropriately on unit.  Can be labile at times.  Denies SI/HI/hallucinations at this time.  72 hour request for discharge will be up tomorrow at 1400.  A.  Support and encouragement offered  R.  Pt remains safe on unit, will continue to monitor.

## 2013-09-02 NOTE — [facility] Group Notes (Signed)
[facility] LCSW Group Therapy Note   09/02/2013/10:00 AM  Type of Therapy and Topic: Group Therapy: Feelings Around Returning Home & Establishing a Supportive Framework and Activity to Identify signs of Improvement or Decompensation   Participation Level:  Did not attend  Mood: Refused to attend at urging of LCSW And later at urging of MHT   Carney Bern, LCSW

## 2013-09-02 NOTE — [facility] Group Notes (Signed)
[facility] Group Notes:  (Nursing/MHT/Case Management/Adjunct)  Date:  09/02/2013  Time:  9:53 AM  Type of Therapy:  Psychoeducational Skills  Participation Level:  Did Not Attend  Participation Quality:  na  Affect:  na  Cognitive:  na  Insight:  None  Engagement in Group:  na  Modes of Intervention:  na  Summary of Progress/Problems:self inventory and psycho-educational review with - focus on recovery through addiction with a focus on healthy coping and healthy support systems.   Bridget Salazar 09/02/2013, 9:53 AM

## 2013-09-02 NOTE — Progress Notes (Signed)
[facility] Group Notes:  (Nursing/MHT/Case Management/Adjunct)  Date:  09/02/2013  Time:  8:09 AM  Type of Therapy:  Psychoeducational Skills  Participation Level:  Minimal  Participation Quality:  Resistant  Affect:  Flat and Resistant  Cognitive:  Appropriate  Insight:  Lacking  Engagement in Group:  Poor and Resistant  Modes of Intervention:  Activity  Summary of Progress/Problems: [facility] Group Notes:  (Nursing/MHT/Case Management/Adjunct)  Date:  09/02/2013  Time:  8:07 AM  Type of Therapy:  Psychoeducational Skills  Participation Level:  Active  Participation Quality:  Appropriate  Affect:  Appropriate  Cognitive:  Appropriate  Insight:  Appropriate  Engagement in Group:  Engaged and Supportive  Modes of Intervention:  Activity  Summary of Progress/Problems: Pts played a game of Pictionary using coping skills.  Caswell Corwin 09/02/2013, 8:07 AM  Caswell Corwin 09/02/2013, 8:09 AM

## 2013-09-02 NOTE — Progress Notes (Signed)
Patient did attend the evening speaker AA meeting.  

## 2013-09-02 NOTE — [facility] Group Notes (Signed)
[facility] Group Notes:  (Nursing/MHT/Case Management/Adjunct)  Date:  09/02/2013  Time:  2:31 PM  Type of Therapy:  Psychoeducational Skills  Participation Level:  None  Participation Quality:  Drowsy and Inattentive  Affect:  Flat  Cognitive:  Lacking  Insight:  Lacking and Limited  Engagement in Group:  None  Modes of Intervention:  Discussion, Education and Exploration  Summary of Progress/Problems: psycho educational video on addiction and sobriety.  Malva Limes 09/02/2013, 2:31 PM

## 2013-09-02 NOTE — Progress Notes (Signed)
Patient ID: Bridget Salazar, female   DOB: [birth year], 27 y.o.   MRN: [id number] [facility] MD Progress Note  09/02/2013 3:48 PM Bridget Salazar  MRN:  [id number]  Subjective:  Bridget Salazar reports, "I feel drowsy today. I just want to get out of here. I need to arrange for a private psychiatrist and therapist to help me. I don't belong here. This place is not for me. I should not have come here".  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 Depression    Axis IV: other psychosocial or environmental problems and Alcoholism Axis V: 41-50 serious symptoms  ADL's:  Fairly intact  Sleep: Fair  Appetite:  Fair  Suicidal Ideation:  Plan:  Denies Intent:  Denies Means:  Denies  Homicidal Ideation:  Plan:  Denies Intent:  Denies Means:  Denies AEB (as evidenced by):  Psychiatric Specialty Exam: Review of Systems  Constitutional: Negative.   HENT: Negative.   Eyes: Negative.   Respiratory: Negative.   Cardiovascular: Negative.   Gastrointestinal: Negative.   Genitourinary: Negative.   Musculoskeletal: Negative.   Skin: Negative.   Neurological: Negative.   Endo/Heme/Allergies: Negative.   Psychiatric/Behavioral: Positive for depression (Currently being stabilized with medication) and substance abuse (Alcoholism). Negative for suicidal ideas, hallucinations and memory loss. The patient is nervous/anxious (Currently being stabilized with medication). The patient does not have insomnia.     Blood pressure 120/76, pulse 136, temperature 100.1 F (37.8 C), temperature source Oral, resp. rate 16, height 5\' 7"  (1.702 m), last menstrual period  08/30/2013, SpO2 99.00%.There is no weight on file to calculate BMI.  General Appearance: Fairly Groomed  Patent attorney::  Poor  Speech:  Clear and Coherent  Volume:  Normal  Mood:  Angry, Anxious, Depressed and Irritable  Affect:  Restricted  Thought Process:  Coherent and Intact  Orientation:  Full (Time, Place, and Person)  Thought Content:  Rumination  Suicidal Thoughts:  No  Homicidal Thoughts:  No  Memory:  Immediate;   Good Recent;   Good Remote;   Good  Judgement:  Impaired  Insight:  Lacking  Psychomotor Activity:  Restlessness and Tremor  Concentration:  Fair  Recall:  Good  Akathisia:  No  Handed:  Right  AIMS (if indicated):     Assets:  Desire for Improvement  Sleep:  Number of Hours: 6.25   Current Medications: Current Facility-Administered Medications  Medication Dose Route Frequency Provider Last Rate Last Dose  . acetaminophen (TYLENOL) tablet 650 mg  650 mg Oral Q6H PRN Nanine Means, NP   650 mg at 09/02/13 1140  . alum & mag hydroxide-simeth (MAALOX/MYLANTA) 200-200-20 MG/5ML suspension 30 mL  30 mL Oral Q4H PRN Nanine Means, NP      . chlordiazePOXIDE (LIBRIUM) capsule 25 mg  25 mg Oral Q6H PRN Nanine Means, NP   25 mg at 09/02/13 1244  . chlordiazePOXIDE (LIBRIUM) capsule 25 mg  25 mg Oral BH-qamhs Nanine Means, NP       Followed by  . [START ON 09/03/2013] chlordiazePOXIDE (LIBRIUM) capsule 25 mg  25 mg Oral Daily Nanine Means, NP      . escitalopram (LEXAPRO) tablet 10 mg  10 mg Oral Daily Nanine Means, NP   10  mg at 09/01/13 0754  . hydrocortisone cream 1 %   Topical BID Nanine Means, NP      . hydrOXYzine (ATARAX/VISTARIL) tablet 25 mg  25 mg Oral Q6H PRN Nanine Means, NP   25 mg at 09/01/13 2226  . influenza vac split quadrivalent PF (FLUARIX) injection 0.5 mL  0.5 mL Intramuscular Tomorrow-1000 Rachael Fee, MD      . loperamide (IMODIUM) capsule 2-4 mg  2-4 mg Oral PRN Nanine Means, NP      . magnesium hydroxide (MILK OF MAGNESIA) suspension 30 mL   30 mL Oral Daily PRN Nanine Means, NP      . multivitamin with minerals tablet 1 tablet  1 tablet Oral Daily Nanine Means, NP   1 tablet at 09/01/13 0756  . ondansetron (ZOFRAN-ODT) disintegrating tablet 4 mg  4 mg Oral Q6H PRN Nanine Means, NP      . terbinafine (LAMISIL) 1 % cream   Topical BID Nanine Means, NP      . thiamine (VITAMIN B-1) tablet 100 mg  100 mg Oral Daily Nanine Means, NP   100 mg at 09/01/13 1610    Lab Results: No results found for this or any previous visit (from the past 48 hour(s)).  Physical Findings: AIMS: Facial and Oral Movements Muscles of Facial Expression: None, normal Lips and Perioral Area: None, normal Jaw: None, normal Tongue: None, normal,Extremity Movements Upper (arms, wrists, hands, fingers): None, normal Lower (legs, knees, ankles, toes): None, normal, Trunk Movements Neck, shoulders, hips: None, normal, Overall Severity Severity of abnormal movements (highest score from questions above): None, normal Incapacitation due to abnormal movements: None, normal Patient's awareness of abnormal movements (rate only patient's report): No Awareness, Dental Status Current problems with teeth and/or dentures?: No Does patient usually wear dentures?: No  CIWA:  CIWA-Ar Total: 2 COWS:     Treatment Plan Summary: Daily contact with patient to assess and evaluate symptoms and progress in treatment Medication management  Plan: Supportive approach/coping skills/relapse prevention. Continue detox. Encouraged out of room, participation in group sessions and application of coping skills when distressed. Will continue to monitor response to/adverse effects of medications in use to assure effectiveness. Continue to monitor mood, behavior and interaction with staff and other patients. Continue current plan of care.  Medical Decision Making Problem Points:  Review of last therapy session (1) and Review of psycho-social stressors (1) Data Points:  Review of  medication regiment & side effects (2) Review of new medications or change in dosage (2)  I certify that inpatient services furnished can reasonably be expected to improve the patient's condition.   Sanjuana Kava, PMHNP, FNP 09/02/2013, 3:48 PM I agreed with the findings, treatment and disposition plan of this patient. Kathryne Sharper, MD

## 2013-09-03 MED ORDER — TERBINAFINE HCL 1 % EX CREA: TOPICAL_CREAM | Freq: Two times a day (BID) | CUTANEOUS | Status: DC

## 2013-09-03 MED ORDER — ESCITALOPRAM OXALATE 10 MG PO TABS: 10.0000 mg | ORAL_TABLET | Freq: Every day | ORAL | Status: DC

## 2013-09-03 NOTE — Progress Notes (Signed)
Adult Psychoeducational Group Note  Date:  09/03/2013 Time:  1:58 PM  Group Topic/Focus:  Self Care:   The focus of this group is to help patients understand the importance of self-care in order to improve or restore emotional, physical, spiritual, interpersonal, and financial health.  Participation Level:  Minimal  Participation Quality:  Drowsy  Affect:  Flat  Cognitive:  Appropriate  Insight: Limited  Engagement in Group:  Limited  Modes of Intervention:  Activity and Discussion  Additional Comments:   Patient engaged in group self assessment activity out of the workbook. Patient shared responses from each question that was discussed during group. Group discussion was based on healthy choices and self care.   Elvera Bicker 09/03/2013, 1:58 PM

## 2013-09-03 NOTE — [facility] Group Notes (Signed)
[facility] LCSW Group Therapy  09/03/2013 2:26 PM  Type of Therapy:  Group Therapy  Participation Level:  Did Not Attend-pt preparing for d/c. Meeting with RN  Smart, HeatherLCSWA 09/03/2013, 2:26 PM
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
## 2013-09-03 NOTE — [facility] Group Notes (Signed)
[facility] LCSW Aftercare Discharge Planning Group Note   09/03/2013 11:34 AM  Participation Quality:  Did not attend-refused.   Smart, HeatherLCSWA

## 2013-09-03 NOTE — Progress Notes (Signed)
Pt to be D/Ced today. Pt requested the flu shot, but has temp of 101.2 this am. Pt denies SI/HI, focused on going home.

## 2013-09-03 NOTE — Progress Notes (Signed)
[facility] Adult Case Management Discharge Plan :  Will you be returning to the same living situation after discharge: Yes,  home At discharge, do you have transportation home?:Yes,  family member Do you have the ability to pay for your medications:Yes,  BCBS insurance  Release of information consent forms completed and in the chart;  Patient's signature needed at discharge.  Patient to Follow up at: Follow-up Information   Follow up with [facility] On 10/08/2013. (Appt. with Dr. Lolly Mustache at 11:00AM for medication management. )    Contact information:   [address] [place], [place] [address] Phone: [phone] Fax: [phone]      Follow up with [facility] On 09/20/2013. (Arrive by 9:30AM for therapy appt with Boneta Lucks. Make sure to bring completed new patient packet and insurance card.  )    Contact information:   [address] [place], [place] [address] Phone: [phone] Fax: [phone]      Patient denies SI/HI:   Yes,  during admission/group/self report.    Safety Planning and Suicide Prevention discussed:  Yes,  SPE not required for this pt as she did not endorse SI during admission or stay at [facility]. SPI pamphlet provided to pt and she was encouraged to share information with support network.   Smart, HeatherLCSWA  09/03/2013, 12:34 PM

## 2013-09-03 NOTE — [facility] Suicide Risk Assessment (Signed)
Suicide Risk Assessment  Discharge Assessment     Demographic Factors:  Caucasian  Mental Status Per Nursing Assessment::   On Admission:     Current Mental Status by Physician: In full contact with reality. There are no suicidal ideas, plans or intent. Her mood is euthymic, her affect is restricted. No active S/S of withdrawal. She states she is ready to be D/C home. She wants  to "hire" a private therapist to continue to work on her issues. States she wants to pursue long term abstinence   Loss Factors: NA  Historical Factors: NA  Risk Reduction Factors:   Positive social support and in school has plans for  herself  Continued Clinical Symptoms:  Alcohol/Substance Abuse/Dependencies  Cognitive Features That Contribute To Risk:  Polarized thinking Thought constriction (tunnel vision)    Suicide Risk:  Minimal: No identifiable suicidal ideation.  Patients presenting with no risk factors but with morbid ruminations; may be classified as minimal risk based on the severity of the depressive symptoms  Discharge Diagnoses:   AXIS I:  Alcohol Dependence, Generalized Anxiety Disorder AXIS II:  Deferred AXIS III:   Past Medical History  Diagnosis Date  . Depression    AXIS IV:  other psychosocial or environmental problems AXIS V:  61-70 mild symptoms  Plan Of Care/Follow-up recommendations:  Activity:  as tolerated Diet:  regular Follow up your relapse prevention plan/outpatient therapist/AA Is patient on multiple antipsychotic therapies at discharge:  No   Has Patient had three or more failed trials of antipsychotic monotherapy by history:  No  Recommended Plan for Multiple Antipsychotic Therapies: NA  Bridget Salazar A 09/03/2013, 12:12 PM

## 2013-09-03 NOTE — Progress Notes (Deleted)
Pt to be D/Ced today. Pt denies SI/HI. Pt requested that her Clonidine be held this am, and according to report, has requested this the past few days. Pt pleasant, alert, hopes to be accepted at [facility]

## 2013-09-05 NOTE — Progress Notes (Signed)
Patient Discharge Instructions:  Next Level Care Provider Has Access to the EMR, 09/05/13 Records provided to [facility] via [facility]/Epic access.  Jerelene Redden, 09/05/2013, 2:06 PM

## 2013-09-20 ENCOUNTER — Ambulatory Visit (HOSPITAL_COMMUNITY): Payer: Self-pay | Admitting: Psychiatry

## 2013-10-08 ENCOUNTER — Ambulatory Visit (HOSPITAL_COMMUNITY): Payer: Self-pay | Admitting: Psychiatry

## 2013-11-07 ENCOUNTER — Inpatient Hospital Stay (HOSPITAL_COMMUNITY)
Admission: AD | Admit: 2013-11-07 | Discharge: 2013-11-07 | Disposition: A | Discharge status: Home / Self Care | Payer: BC Managed Care – PPO | Source: Ambulatory Visit | Attending: Obstetrics and Gynecology | Admitting: Obstetrics and Gynecology

## 2013-11-07 ENCOUNTER — Encounter (HOSPITAL_COMMUNITY): Payer: Self-pay | Admitting: *Deleted

## 2013-11-07 DIAGNOSIS — R109 Unspecified abdominal pain: Secondary | ICD-10-CM | POA: Insufficient documentation

## 2013-11-07 DIAGNOSIS — N938 Other specified abnormal uterine and vaginal bleeding: Secondary | ICD-10-CM | POA: Insufficient documentation

## 2013-11-07 DIAGNOSIS — N939 Abnormal uterine and vaginal bleeding, unspecified: Secondary | ICD-10-CM

## 2013-11-07 DIAGNOSIS — F17200 Nicotine dependence, unspecified, uncomplicated: Secondary | ICD-10-CM | POA: Insufficient documentation

## 2013-11-07 DIAGNOSIS — Z3202 Encounter for pregnancy test, result negative: Secondary | ICD-10-CM

## 2013-11-07 DIAGNOSIS — N926 Irregular menstruation, unspecified: Secondary | ICD-10-CM

## 2013-11-07 DIAGNOSIS — N949 Unspecified condition associated with female genital organs and menstrual cycle: Secondary | ICD-10-CM | POA: Insufficient documentation

## 2013-11-07 LAB — URINALYSIS, ROUTINE W REFLEX MICROSCOPIC
Bilirubin Urine: NEGATIVE
GLUCOSE, UA: NEGATIVE mg/dL
KETONES UR: 15 mg/dL — AB
Leukocytes, UA: NEGATIVE
Nitrite: NEGATIVE
Protein, ur: NEGATIVE mg/dL
Specific Gravity, Urine: 1.03 (ref 1.005–1.030)
Urobilinogen, UA: 0.2 mg/dL (ref 0.0–1.0)
pH: 5.5 (ref 5.0–8.0)

## 2013-11-07 LAB — CBC
HEMATOCRIT: 40 % (ref 36.0–46.0)
HEMOGLOBIN: 14 g/dL (ref 12.0–15.0)
MCH: 29.6 pg (ref 26.0–34.0)
MCHC: 35 g/dL (ref 30.0–36.0)
MCV: 84.6 fL (ref 78.0–100.0)
Platelets: 202 10*3/uL (ref 150–400)
RBC: 4.73 MIL/uL (ref 3.87–5.11)
RDW: 13.9 % (ref 11.5–15.5)
WBC: 8.8 10*3/uL (ref 4.0–10.5)

## 2013-11-07 LAB — URINE MICROSCOPIC-ADD ON

## 2013-11-07 LAB — POCT PREGNANCY, URINE: Preg Test, Ur: NEGATIVE

## 2013-11-07 LAB — HCG, QUANTITATIVE, PREGNANCY: hCG, Beta Chain, Quant, S: 1 m[IU]/mL (ref <5)

## 2013-11-07 NOTE — MAU Note (Signed)
Pt LMP 2/5/20215, concerned she may be pregnant. Having bleeding and cramping.

## 2013-11-07 NOTE — MAU Provider Note (Signed)
Chief Complaint: Possible Pregnancy, Vaginal Bleeding and Abdominal Cramping   First Provider Initiated Contact with Patient 11/07/13 2318     SUBJECTIVE HPI: Bridget Pavlovugenya Salazar is a 28 y.o. female who presents with concern for pregnancy. LMP 10/11/2013. Started bleeding and cramping after intercourse last night. Initially made no mention of home UPT, but then stated she had a positive home UPT. UPT in MAU negative, so hCG drawn.  Describes bleeding is moderate, cramping now mild.  Past Medical History  Diagnosis Date  . Depression    OB History  No data available   No past surgical history on file. History   Social History  . Marital Status: Single    Spouse Name: N/A    Number of Children: N/A  . Years of Education: N/A   Occupational History  . Not on file.   Social History Main Topics  . Smoking status: Current Every Day Smoker -- 0.50 packs/day    Types: Cigarettes  . Smokeless tobacco: Not on file  . Alcohol Use: 33.6 oz/week    56 Glasses of wine per week  . Drug Use: No  . Sexual Activity: Yes    Birth Control/ Protection: None   Other Topics Concern  . Not on file   Social History Narrative  . No narrative on file   No current facility-administered medications on file prior to encounter.   Current Outpatient Prescriptions on File Prior to Encounter  Medication Sig Dispense Refill  . escitalopram (LEXAPRO) 10 MG tablet Take 1 tablet (10 mg total) by mouth daily.  30 tablet  0  . terbinafine (LAMISIL) 1 % cream Apply topically 2 (two) times daily.  30 g  0   No Known Allergies  ROS: Pertinent items in HPI. Denies fever, chills, urinary complaints, dizziness, syncope, abdominal pain, dyspareunia, heavy bleeding, passage of clots or tissue  OBJECTIVE Blood pressure 117/87, pulse 105, temperature 99.1 F (37.3 C), temperature source Oral, resp. rate 16, height 5\' 7"  (1.702 m), weight 60.691 kg (133 lb 12.8 oz), last menstrual period 10/11/2013. GENERAL:  Well-developed, well-nourished female in no acute distress.  HEENT: Normocephalic HEART: normal rate RESP: normal effort ABDOMEN: Soft, non-tender NEURO: Alert and oriented SPECULUM EXAM: Deferred  LAB RESULTS Results for orders placed during the hospital encounter of 11/07/13 (from the past 24 hour(s))  URINALYSIS, ROUTINE W REFLEX MICROSCOPIC     Status: Abnormal   Collection Time    11/07/13 10:00 PM      Result Value Ref Range   Color, Urine YELLOW  YELLOW   APPearance CLEAR  CLEAR   Specific Gravity, Urine 1.030  1.005 - 1.030   pH 5.5  5.0 - 8.0   Glucose, UA NEGATIVE  NEGATIVE mg/dL   Hgb urine dipstick MODERATE (*) NEGATIVE   Bilirubin Urine NEGATIVE  NEGATIVE   Ketones, ur 15 (*) NEGATIVE mg/dL   Protein, ur NEGATIVE  NEGATIVE mg/dL   Urobilinogen, UA 0.2  0.0 - 1.0 mg/dL   Nitrite NEGATIVE  NEGATIVE   Leukocytes, UA NEGATIVE  NEGATIVE  URINE MICROSCOPIC-ADD ON     Status: Abnormal   Collection Time    11/07/13 10:00 PM      Result Value Ref Range   Squamous Epithelial / LPF RARE  RARE   WBC, UA 0-2  <3 WBC/hpf   RBC / HPF 3-6  <3 RBC/hpf   Bacteria, UA FEW (*) RARE  POCT PREGNANCY, URINE     Status: None   Collection Time  11/07/13 10:03 PM      Result Value Ref Range   Preg Test, Ur NEGATIVE  NEGATIVE  HCG, QUANTITATIVE, PREGNANCY     Status: None   Collection Time    11/07/13 10:34 PM      Result Value Ref Range   hCG, Beta Chain, Quant, S 1  <5 mIU/mL  ABO/RH     Status: None   Collection Time    11/07/13 10:34 PM      Result Value Ref Range   ABO/RH(D) A POS    CBC     Status: None   Collection Time    11/07/13 10:34 PM      Result Value Ref Range   WBC 8.8  4.0 - 10.5 K/uL   RBC 4.73  3.87 - 5.11 MIL/uL   Hemoglobin 14.0  12.0 - 15.0 g/dL   HCT 16.1  09.6 - 04.5 %   MCV 84.6  78.0 - 100.0 fL   MCH 29.6  26.0 - 34.0 pg   MCHC 35.0  30.0 - 36.0 g/dL   RDW 40.9  81.1 - 91.4 %   Platelets 202  150 - 400 K/uL    IMAGING No results  found.  MAU COURSE  ASSESSMENT 1. Menstrual bleeding problem   2. Pregnancy test negative    PLAN Discharge home in stable condition. Bleeding precautions. Offered prescription for Provera or birth control pills to slow down bleeding, but patient states she isn't bleeding very badly and declines medication. Will discuss with gynecologist. Follow-up Information   Follow up with Gynecologist. (As needed for evaluation of menstrual irregularities)       Follow up with [facility]. (As needed in emergencies)    Contact information:   [address] [id number] [place] [address] [phone]        Medication List         escitalopram 10 MG tablet  Commonly known as:  LEXAPRO  Take 1 tablet (10 mg total) by mouth daily.     terbinafine 1 % cream  Commonly known as:  LAMISIL  Apply topically 2 (two) times daily.       Starr School, [place] 11/07/2013  11:17 PM

## 2013-11-07 NOTE — Discharge Instructions (Signed)
Menstruation Menstruation is the monthly passing of blood, tissue, fluid and mucus, also know as a period. Your body is shedding the lining of the uterus. The flow, or amount of blood, usually lasts from 3 7 days each month. Hormones control the menstrual cycle. Hormones are a chemical substance produced by endocrine glands in the body to regulate different bodily functions. The first menstrual period may start any time between age 26 years to 16 years. However, it usually starts around age 12 years. Some girls have regular monthly menstrual cycles right from the beginning. However, it is not unusual to have only a couple of drops of blood or spotting when you first start menstruating. It is also not unusual to have two periods a month or miss a month or two when first starting your periods. SYMPTOMS   Mild to moderate abdominal cramps.  Aching or pain in the lower back area. Symptoms may occur 5 10 days before your menstrual period starts. These symptoms are referred to as premenstrual syndrome (PMS). These symptoms can include:  Headache.  Breast tenderness and swelling.  Bloating.  Tiredness (fatigue).  Mood changes.  Craving for certain foods. These are normal signs and symptoms and can vary in severity. To help relieve these problems, ask your caregiver if you can take over-the-counter medications for pain or discomfort. If the symptoms are not controllable, see your caregiver for help.  HORMONES INVOLVED IN MENSTRUATION Menstruation comes about because of hormones produced by the pituitary gland in the brain and the ovaries that affect the uterine lining. First, the pituitary gland in the brain produces the hormone follicle stimulating hormone (FSH). FSH stimulates the ovaries to produce estrogen, which thickens the uterine lining and begins to develop an egg in the ovary. About 14 days later, the pituitary gland produces another hormone called luteinizing hormone (LH). LH causes the egg  to come out of a sac in the ovary (ovulation). The empty sac on the ovary called the corpus luteum is stimulated by another hormone from the pituitary gland called luteotropin. The corpus luteum begins to produce the estrogen and progesterone hormone. The progesterone hormone prepares the lining of the uterus to have the fertilized egg (egg combined with sperm) attach to the lining of the uterus and begin to develop into a fetus. If the egg is not fertilized, the corpus luteum stops producing estrogen and progesterone, it disappears, the lining of the uterus sloughs off and a menstrual period begins. Then the menstrual cycle starts all over again and will continue monthly unless pregnancy occurs or menopause begins. The secretion of hormones is complex. Various parts of the body become involved in many chemical activities. Female sex hormones have other functions in a woman's body as well. Estrogen increases a woman's sex drive (libido). It naturally helps body get rid of fluids (diuretic). It also aids in the process of building new bone. Therefore, maintaining hormonal health is essential to all levels of a woman's well being. These hormones are usually present in normal amounts and cause you to menstruate. It is the relationship between the (small) levels of the hormones that is critical. When the balance is upset, menstrual irregularities can occur. HOW DOES THE MENSTRUAL CYCLE HAPPEN?  Menstrual cycles vary in length from 21 35 days with an average of 29 days. The cycle begins on the first day of bleeding. At this time, the pituitary gland in the brain releases FSH that travels through the bloodstream to the ovaries. The FSH stimulates the   follicles in the ovaries. This prepares the body for ovulation that occurs around the 14th day of the cycle. The ovaries produce estrogen, and this makes sure conditions are right in the uterus for implantation of the fertilized egg.  When the levels of estrogen reach a  high enough level, it signals the gland in the brain (pituitary gland) to release a surge of LH. This causes the release of the ripest egg from its follicle (ovulation). Usually only one follicle releases one egg, but sometimes more than one follicle releases an egg especially when stimulating the ovaries for in vitro fertilization. The egg can then be collected by either fallopian tube to await fertilization. The burst follicle within the ovary that is left behind is now called the corpus luteum or "yellow body." The corpus luteum continues to give off (secrete) reduced amounts of estrogen. This closes and hardens the cervix. It dries up the mucus to the naturally infertile condition.  The corpus luteum also begins to give off greater amounts of progesterone. This causes the lining of the uterus (endometrium) to thicken even more in preparation for the fertilized egg. The egg is starting to journey down from the fallopian tube to the uterus. It also signals the ovaries to stop releasing eggs. It assists in returning the cervical mucus to its infertile state.  If the egg implants successfully into the womb lining and pregnancy occurs, progesterone levels will continue to raise. It is often this hormone that gives some pregnant women a feeling of well being, like a "natural high." Progesterone levels drop again after childbirth.  If fertilization does not occur, the corpus luteum dies, stopping the production of hormones. This sudden drop in progesterone causes the uterine lining to break down, accompanied by blood (menstruation).  This starts the cycle back at day 1. The whole process starts all over again. Woman go through this cycle every month from puberty to menopause. Women have breaks only for pregnancy and breastfeeding (lactation), unless the woman has health problems that affect the female hormone system or chooses to use oral contraceptives to have unnatural menstrual periods. HOME CARE  INSTRUCTIONS   Keep track of your periods by using a calendar.  If you use tampons, get the least absorbent to avoid toxic shock syndrome.  Do not leave tampons in the vagina over night or longer than 6 hours.  Wear a sanitary pad over night.  Exercise 3 5 times a week or more.  Avoid foods and drinks that you know will make your symptoms worse before or during your period. SEEK MEDICAL CARE IF:   You develop a fever with your period.  Your periods are lasting more than 7 days.  Your period is so heavy that you have to change pads or tampons every 30 minutes.  You develop clots with your period and never had clots before.  You cannot get relief from over-the-counter medication for your symptoms.  Your period has not started, and it has been longer than 35 days. Document Released: 08/13/2002 Document Revised: 06/13/2013 Document Reviewed: 03/22/2013 [employer] Patient Information 2014 [employer].  

## 2013-11-08 LAB — ABO/RH: ABO/RH(D): A POS

## 2013-11-18 NOTE — [facility] Provider Note (Signed)
Attestation of Attending Supervision of Advanced Practitioner: Evaluation and management procedures were performed by the PA/NP/CNM/OB Fellow under my supervision/collaboration. Chart reviewed and agree with management and plan.  Jeraline Marcinek V 11/18/2013 2:23 PM

## 2014-03-01 ENCOUNTER — Encounter: Payer: Self-pay | Admitting: Nurse Practitioner

## 2014-03-01 ENCOUNTER — Ambulatory Visit (INDEPENDENT_AMBULATORY_CARE_PROVIDER_SITE_OTHER): Payer: BC Managed Care – PPO | Admitting: Nurse Practitioner

## 2014-03-01 VITALS — BP 127/88 | HR 89 | Temp 97.4°F | Ht 67.0 in | Wt 138.5 lb

## 2014-03-01 DIAGNOSIS — F3289 Other specified depressive episodes: Secondary | ICD-10-CM

## 2014-03-01 DIAGNOSIS — F329 Major depressive disorder, single episode, unspecified: Secondary | ICD-10-CM

## 2014-03-01 DIAGNOSIS — Z0000 Encounter for general adult medical examination without abnormal findings: Secondary | ICD-10-CM

## 2014-03-01 DIAGNOSIS — Z01419 Encounter for gynecological examination (general) (routine) without abnormal findings: Secondary | ICD-10-CM

## 2014-03-01 DIAGNOSIS — F32A Depression, unspecified: Secondary | ICD-10-CM | POA: Insufficient documentation

## 2014-03-01 NOTE — Addendum Note (Signed)
Addended by: Aldona LentoFISHER, ELIZABETH L on: 03/01/2014 09:32 AM   Modules accepted: Orders

## 2014-03-01 NOTE — Patient Instructions (Signed)
Pap Test A Pap test checks the cells on the surface of your cervix. Your doctor will look for cell changes that are not normal, an infection, or cancer. If the cells no longer look normal, it is called dysplasia. Dysplasia can turn into cancer. Regular Pap tests are important to stop cancer from developing. BEFORE THE PROCEDURE  Ask your doctor when to schedule your Pap test. Timing the test around your period may be important.  Do not douche or have sex (intercourse) for 24 hours before the test.  Do not put creams on your vagina or use tampons for 24 hours before the test.  Go pee (urinate) just before the test. PROCEDURE  You will lie on an exam table with your feet in stirrups.  A warm metal or plastic tool (speculum) will be put in your vagina to open it up.  Your doctor will use a small, plastic brush or wooden spatula to take cells from your cervix.  The cells will be put in a lab container.  The cells will be checked under a microscope to see if they are normal or not. AFTER THE PROCEDURE Get your test results. If they are abnormal, you may need more tests. Document Released: 09/25/2010 Document Revised: 11/15/2011 Document Reviewed: 08/19/2011 [employer] Patient Information 2015 [employer]. This information is not intended to replace advice given to you by your health care Gio Janoski. Make sure you discuss any questions you have with your health care Jolyne Laye.  

## 2014-03-01 NOTE — Progress Notes (Signed)
History:  Bridget Salazar is a 28 y.o. G0P0 who [facility] for new gyn exam, pap smear and STd screening. She denies any vaginal or breast issues.  She denies any abnormal paps. She uses condoms for birth control and does not want a different form. She admits to unprotected intercourse and would like STD screening. She has a history of alcohol abuse , depression , anxiety and went through alcohol detox recently. She smokes cigarettes.   The following portions of the patient's history were reviewed and updated as appropriate: allergies, current medications, past family history, past medical history, past social history, past surgical history and problem list.  Review of Systems:  Pertinent items are noted in HPI.  Objective:  Physical Exam BP 127/88  Pulse 89  Temp(Src) 97.4 F (36.3 C)  Ht 5\' 7"  (1.702 m)  Wt 138 lb 8 oz (62.823 kg)  BMI 21.69 kg/m2  LMP 02/15/2014 GENERAL: Well-developed, well-nourished female in no acute distress.  HEENT: Normocephalic, atraumatic.  NECK: Supple. Normal thyroid.  LUNGS: Normal rate. Clear to auscultation bilaterally.  HEART: Regular rate and rhythm with no adventitious sounds.  BREASTS: Symmetric in size. No masses, skin changes, nipple drainage, or lymphadenopathy. ABDOMEN: Soft, nontender, nondistended. No organomegaly. Normal bowel sounds appreciated in all quadrants.  PELVIC: Normal external female genitalia. Vagina is pink and rugated.  Normal discharge. Normal cervix contour. Pap smear obtained. Uterus is normal in size. No adnexal mass or tenderness.  EXTREMITIES: No cyanosis, clubbing, or edema, 2+ distal pulses.   Labs and Imaging No results found.  Assessment & Plan:  Assessment:  Well Woman Exam Alcohol Abuse/ recent detox Anxiety/ depression Risky sexual behavior Smoker  Plans: Pap smear, wet prep, GC, Chlamydia done today/ will be called with any positive results She get RPR and HIV done  elsewhere Declines change in birth control Return one year  Delbert PhenixLinda M Barefoot, NP 03/01/2014 8:49 AM

## 2014-03-02 LAB — WET PREP, GENITAL
Trich, Wet Prep: NONE SEEN
WBC, Wet Prep HPF POC: NONE SEEN
YEAST WET PREP: NONE SEEN

## 2014-03-04 LAB — CYTOLOGY - PAP

## 2014-03-07 ENCOUNTER — Telehealth: Payer: Self-pay | Admitting: *Deleted

## 2014-03-07 MED ORDER — METRONIDAZOLE 500 MG PO TABS: 500.0000 mg | ORAL_TABLET | Freq: Two times a day (BID) | ORAL | Status: AC

## 2014-03-07 NOTE — Telephone Encounter (Addendum)
Message copied by Jill SideAY, Haylin Camilli L on Thu Mar 07, 2014 10:10 AM ------      Message from: BAREFOOT, LINDA M      Created: Sat Mar 02, 2014  1:09 PM       Can we call in Flagyl 500 mg po BID x7 days, Thank you, Bonita QuinLinda  ------ Called pt and informed her of test result of +BV and treatment required.  Rx will be sent to her pharmacy. Pt voiced understanding.           Duwayne Matters RNC
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
